# Patient Record
Sex: Female | Born: 1955 | Race: White | Hispanic: No
Health system: Southern US, Community
[De-identification: ages and names within clinical notes are randomized; demographics above are authoritative.]

---

## 2004-10-03 ENCOUNTER — Encounter (INDEPENDENT_AMBULATORY_CARE_PROVIDER_SITE_OTHER): Payer: Self-pay | Admitting: Specialist

## 2004-10-03 ENCOUNTER — Ambulatory Visit (HOSPITAL_COMMUNITY): Admission: RE | Admit: 2004-10-03 | Discharge: 2004-10-03 | Payer: Self-pay | Admitting: Vascular Surgery

## 2005-01-16 ENCOUNTER — Encounter: Admission: RE | Admit: 2005-01-16 | Discharge: 2005-01-16 | Payer: Self-pay | Admitting: Orthopedic Surgery

## 2005-09-22 ENCOUNTER — Other Ambulatory Visit: Admission: RE | Admit: 2005-09-22 | Discharge: 2005-09-22 | Payer: Self-pay | Admitting: Internal Medicine

## 2006-12-10 ENCOUNTER — Other Ambulatory Visit: Admission: RE | Admit: 2006-12-10 | Discharge: 2006-12-10 | Payer: Self-pay | Admitting: Obstetrics and Gynecology

## 2007-03-03 ENCOUNTER — Encounter (INDEPENDENT_AMBULATORY_CARE_PROVIDER_SITE_OTHER): Payer: Self-pay | Admitting: Obstetrics and Gynecology

## 2007-03-03 ENCOUNTER — Inpatient Hospital Stay (HOSPITAL_COMMUNITY): Admission: RE | Admit: 2007-03-03 | Discharge: 2007-03-05 | Payer: Self-pay | Admitting: Obstetrics and Gynecology

## 2010-08-27 NOTE — Op Note (Signed)
NAMEITATI, BROCKSMITH NO.:  [id number]   MEDICAL RECORD NO.:  [id number]          PATIENT TYPE:  INP   LOCATION:  9306                          FACILITY:  [facility]   PHYSICIAN:  Gerald Leitz, MD          DATE OF BIRTH:  March 15, 1956   DATE OF PROCEDURE:  03/03/2007  DATE OF DISCHARGE:                               OPERATIVE REPORT   PREOPERATIVE DIAGNOSES:  1. Complex ovarian cyst.  2. Pelvic pain.  3. Uterine prolapse.   POSTOPERATIVE DIAGNOSES:  1. Complex ovarian cyst.  2. Pelvic pain.  3. Uterine prolapse.  4. Pelvic adhesive disease.   PROCEDURE:  Laparoscopy with conversion to total abdominal hysterectomy,  bilateral salpingo-oophorectomy.   SURGEON:  Dr. Gerald Leitz.   ASSISTANT:  Charles A. Delcambre, MD   ANESTHESIA:  General.   COMPLICATIONS:  None.   FINDINGS:  Approximately 8 cm right ovary with torsion and adhesion to  the colon.   SPECIMEN:  Bilateral ovaries and fallopian tubes.   DISPOSITION OF SPECIMEN:  Sent to pathology.   ESTIMATED BLOOD LOSS:  250 mL.   URINE OUTPUT:  300 mL.   FLUIDS:  3100 mL of lactate Ringer's.   PROCEDURE:  The patient was taken to the operating room where she was  placed in dorsal lithotomy position.  She was prepped in the usual  sterile fashion.  Bivalve speculum placed into the vaginal vault.  The  anterior lip of the cervix was grasped with tenaculum and a uterine  manipulator was placed without difficulty.  The speculum was removed.  The patient was then draped in sterile fashion.  A 5 mm skin incision  was made at the umbilicus with a scalpel.  5 mm trocar was placed under  direct visualization.  Pneumoperitoneum was obtained with CO2 gas.  The  pelvis was examined.  A 5 mm skin incision was made in the right lower  quadrant.  A 5 mm trocar was placed under direct visualization.  This  was repeated on the left lower quadrant.  The pelvis was examined with  findings as above.  The patient was noted to  have a large right ovarian  cyst that appeared adherent to the large bowel or colon.  Decision was  made at this point to continue the surgery via an open laparotomy  incision.  Pneumoperitoneum was released, the trocars were removed of  direct visualization.  Skin sites were closed with Dermabond.  Pfannenstiel skin incision was made with a scalpel and taken to the  underlying layer of fascia.  The fascia was incised in midline.  Incision was extended laterally with Mayo scissors.  Superior aspect  fascial incision was grasped with Kocher clamps, elevated, underlying  rectus muscles dissected off with Mayo scissors.  The rectus muscle  separated in the midline.  Peritoneum was identified and entered  sharply.  This incision was extended superiorly and inferiorly with good  visualization of the bladder.  Balfour retractor was placed.  The bowel  was packed with moist laparotomy sponges.  The cornu of the uterus  was  grasped with Tresa Endo clamp.  The right ovary was dissected from the colon  and posterior cul-de-sac via sharp and blunt dissection.  The  uterosacral ligaments were suture ligated bilaterally, transected with  the Bovie.  The vesicouterine peritoneum was incised to the midline  bilaterally. The ureters were identified bilaterally.  Heaney clamp was  placed along the infundibulopelvic ligament bilaterally.  They were  transected with Mayo scissors and then ligated with a free tie of 0  Vicryl followed by a suture ligature of 0 Vicryl.  Excellent hemostasis  was noted.  The right ovary was then amputated from the uterus and sent  to pathology.  The bladder was dissected off the lower uterine segment  and cervix with Metzenbaum scissors and sponge stick.  The uterine  ligaments were skeletonized bilaterally, Heaney clamped, transected with  Mayo scissors and suture ligated with 0 Vicryl.  This was repeated on  the cardinal ligaments.  The uterosacral ligaments were clamped with   Heaney clamps, transected and suture ligated with 0 Vicryl.  The uterus  and cervix were amputated with Jorgenson scissors.  The vaginal cuff  angles suture was placed bilaterally and this was done with 0 Vicryl.  Vaginal cuff was closed with running stitch of 0 Vicryl. Abdomen was  then copiously irrigated.  The patient was noted to have some bleeding  along her posterior cul-de-sac high at the area of blunt dissection.  Some of the oozing involved the colon.  Pressure was applied for five  minutes.  Bleeding had decreased significantly.  Avitene was placed in  the posterior cul-de-sac.  All instruments were removed from the  patient's abdomen.  Sponge, lap, needle counts were correct x2.  The  fascia was closed with 0 PDS.  Skin was closed with staples.  Again,  sponge, lap and needle counts were correct.      Gerald Leitz, MD  Electronically Signed     TC/MEDQ  D:  03/03/2007  T:  03/04/2007  Job:  [id number]

## 2010-08-27 NOTE — H&P (Signed)
Tammy Owens, Tammy [name]:  [id number]   MEDICAL RECORD NO.:  [id number]          PATIENT TYPE:  INP   LOCATION:                                FACILITY:  [facility]   PHYSICIAN:  Gerald Leitz, MD          DATE OF BIRTH:  08/30/1955   DATE OF ADMISSION:  03/03/2007  DATE OF DISCHARGE:                              HISTORY & PHYSICAL   HISTORY OF PRESENT ILLNESS:  This is a 55 year old G3, P2-1-0-2, with  grade III uterine prolapse, complex ovarian cyst and pelvic pain who  desires definitive therapy via hysterectomy and bilateral salpingo-  oophorectomy.   OBSTETRICAL HISTORY:  1. Spontaneous vaginal delivery x2.  2. C-section x1.   GYNECOLOGIC HISTORY:  Menarche at age 45.   CONTRACEPTION:  Tubal ligation.   No history of abnormal Pap smear.  Last Pap smear on December 10, 2006,  was normal.   PAST MEDICAL HISTORY:  Negative.   PAST SURGICAL HISTORY:  1. Right ear surgery.  2. C-section x1.  3. Appendectomy.  4. Leg surgery.   MEDICATIONS:  Darvocet, Motrin.   ALLERGIES:  CODEINE.   SOCIAL HISTORY:  The patient owns a cleaning service.  She denies  tobacco, alcohol, or illicit drug use.   FAMILY HISTORY:  Negative for breast, ovarian, colon cancer.   REVIEW OF SYSTEMS:  Negative except as stated in the history of current  illness.   PHYSICAL EXAMINATION:  VITAL SIGNS:  Blood pressure 138/86, weight 133  pounds.  CARDIOVASCULAR:  Regular rate and rhythm.  LUNGS:  Clear to auscultation bilaterally.  ABDOMEN:  Soft, nontender, nondistended.  No masses.  PELVIC:  Normal external female genitalia.  No lower vaginal or cervical  lesions noted.  Bimanual exam reveals normal size uterus, right adnexal  mass, small left adnexal mass.  On exam with Valsalva, the uterus  distends approximately half way to the introitus.  EXTREMITIES:  No clubbing, cyanosis, or edema.   Ultrasound performed on November 17, 2006, shows the uterus to measure 9.2  x 4.3 x 5.2-cm.   There is a large complex cyst in the right adnexa at  7.2 x 6.3 x 6.8-cm.  There is also a complex cyst in the left ovary 2.4-  cm.  CA-125 checked on December 11, 2006, was 9.5 which is negative.   IMPRESSION:  1. Grade 2 uterine prolapse.  2. Complex ovarian cyst and pelvic pain.   PLAN:  The patient desires definitive therapy via laparoscopic-assisted  vaginal hysterectomy and bilateral salpingo-oophorectomy.  Risks,  benefits, alternatives of surgery were discussed with the patient  including, but not limited to, infection, bleeding, damage to bowel,  bladder, surrounding organs with the need for further surgery, risk of  transfusion, HIV, hepatitis B and C were discussed.  The patient  understands all risks and desires to proceed with laparoscopic-assisted  vaginal hysterectomy and bilateral salpingo-oophorectomy.      Gerald Leitz, MD  Electronically Signed     TC/MEDQ  D:  02/26/2007  T:  02/26/2007  Job:  [id number]

## 2010-08-30 NOTE — Discharge Summary (Signed)
Tammy Owens, Tammy Owens                ACCOUNT NO.:  [id number]   MEDICAL RECORD NO.:  [id number]          PATIENT TYPE:  INP   LOCATION:  9306                          FACILITY:  [facility]   PHYSICIAN:  Gerald Leitz, MD          DATE OF BIRTH:  03/09/1956   DATE OF ADMISSION:  03/03/2007  DATE OF DISCHARGE:  03/05/2007                               DISCHARGE SUMMARY   ADMISSION DIAGNOSIS:  1. Grade 3 uterine prolapse.  2. Complex ovarian cyst.  3. Pelvic pain.   DISCHARGE DIAGNOSES:  1. Grade 3 uterine prolapse.  2. Complex ovarian cyst.  3. Pelvic pain.  4. Status post total abdominal hysterectomy and bilateral salpingo-      oophorectomy.   BRIEF HOSPITAL COURSE:  This is 55 year old with uterine prolapse and  complex ovarian cyst and pelvic pain who underwent laparoscopy with  conversion to laparotomy and total abdominal hysterectomy and bilateral  salpingo-oophorectomy.  During her surgery, the patient was noted to  have an 8-cm right ovarian cyst that was torsed as well as adhesions of  the uterus to the colon.  She did well postoperatively.  Hemoglobin on  postoperative day #1 was 9.7  She is discharged home on March 05, 2007 in stable condition   DISCHARGE MEDICATIONS:  Motrin and Vicodin.   FOLLOW UP:  She will follow up on March 08, 2007 for staple removal  and in 2 weeks for postoperative visit.   ACTIVITY:  Pelvic rest.   DIET:  Regular.      Gerald Leitz, MD  Electronically Signed     TC/MEDQ  D:  03/23/2007  T:  03/23/2007  Job:  [id number]

## 2010-08-30 NOTE — Op Note (Signed)
[name], Tammy [name]:  [id number]   MEDICAL RECORD NO.:  [id number]          PATIENT TYPE:  OIB   LOCATION:  2899                         FACILITY:  [facility]   PHYSICIAN:  Di Kindle. 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   POSTOPERATIVE DIAGNOSIS:  Painful varicose veins of the right lower  extremity with incompetent right greater saphenous vein.   PROCEDURE:  Ligation and stripping of right greater saphenous vein and  segmental excision of varicose veins of the right lower extremity.   SURGEON:  Di Kindle. Edilia Bo, M.D.   ASSISTANT:  Nurse.   ANESTHESIA:  General.   TECHNIQUE:  The patient was taken to the operating room and received a  general anesthetic.  The right lower extremity was prepped and draped in the  usual sterile fashion.  A small oblique incision was made in the inguinal  crease on the right and the saphenofemoral junction was dissected free with  branches divided between 3-0 silk ties.  The vein was then clamped and  suture ligated at the saphenofemoral junction.  The stripper was then passed  down the vein through the incompetent valves to just above the knee, where  was identified and through a longitudinal incision, the vein was controlled  with a 2-0 silk tie.  This was then left.  All of her large varicose veins  had been marked with the patient standing preoperatively and using small  longitudinal stab incisions, a stab avulsion was performed on all the areas  which had been marked.  There was a cluster along the anterior aspect of the  right leg and also the medial aspect of the right leg.  There were also some  smaller clusters along the lateral aspect of the right leg.  Pressure was  held for  hemostasis.  This stab wounds were all closed with 4-0 Vicryls.  The greater saphenous vein was then stripped using an inversion stripping  technique and pressure held for hemostasis.  The distal incision was closed  with a 3-0 Vicryl and the skin closed with 4-0 Vicryl.  The groin incision  was closed with a deep layer of 3-0 Vicryl and the skin with 4-0 Vicryl.  A  sterile dressing was applied.  The patient tolerated procedure well and was  transferred to the recovery room in satisfactory condition.  All needle and  sponge counts were correct.       CSD/MEDQ  D:  10/03/2004  T:  10/03/2004  Job:  [id number]

## 2010-10-03 ENCOUNTER — Emergency Department (HOSPITAL_BASED_OUTPATIENT_CLINIC_OR_DEPARTMENT_OTHER)
Admission: EM | Admit: 2010-10-03 | Discharge: 2010-10-03 | Disposition: A | Discharge status: Home / Self Care | Payer: BC Managed Care – PPO | Attending: Emergency Medicine | Admitting: Emergency Medicine

## 2010-10-03 ENCOUNTER — Emergency Department (INDEPENDENT_AMBULATORY_CARE_PROVIDER_SITE_OTHER): Payer: BC Managed Care – PPO

## 2010-10-03 DIAGNOSIS — W19XXXA Unspecified fall, initial encounter: Secondary | ICD-10-CM

## 2010-10-03 DIAGNOSIS — M79609 Pain in unspecified limb: Secondary | ICD-10-CM

## 2010-10-03 DIAGNOSIS — S42253A Displaced fracture of greater tuberosity of unspecified humerus, initial encounter for closed fracture: Secondary | ICD-10-CM | POA: Insufficient documentation

## 2010-10-03 DIAGNOSIS — W1809XA Striking against other object with subsequent fall, initial encounter: Secondary | ICD-10-CM | POA: Insufficient documentation

## 2010-10-03 DIAGNOSIS — Y92009 Unspecified place in unspecified non-institutional (private) residence as the place of occurrence of the external cause: Secondary | ICD-10-CM | POA: Insufficient documentation

## 2011-01-21 LAB — DIFFERENTIAL
Basophils Absolute: 0
Basophils Relative: 1
Eosinophils Absolute: 0.1 — ABNORMAL LOW
Eosinophils Relative: 1
Lymphocytes Relative: 30
Lymphs Abs: 2.3
Monocytes Absolute: 0.4
Monocytes Relative: 5
Neutro Abs: 4.8
Neutrophils Relative %: 64

## 2011-01-21 LAB — BASIC METABOLIC PANEL
BUN: 15
CO2: 28
Calcium: 9.7
Chloride: 108
Creatinine, Ser: 0.68
GFR calc Af Amer: >60
GFR calc non Af Amer: >60
Glucose, Bld: 86
Potassium: 4.6
Sodium: 142

## 2011-01-21 LAB — TYPE AND SCREEN
ABO/RH(D): B POS
Antibody Screen: NEGATIVE

## 2011-01-21 LAB — CBC
HCT: 28.5 — ABNORMAL LOW
HCT: 36.4
Hemoglobin: 9.7 — ABNORMAL LOW
Hemoglobin: 12.5
MCHC: 34.2
MCHC: 34.3
MCV: 91.8
MCV: 90.4
Platelets: 202
Platelets: 410 — ABNORMAL HIGH
RBC: 3.1 — ABNORMAL LOW
RBC: 4.03
RDW: 13
RDW: 13
WBC: 6.2
WBC: 7.5

## 2011-01-21 LAB — ABO/RH: ABO/RH(D): B POS

## 2011-01-21 LAB — PREGNANCY, URINE: Preg Test, Ur: NEGATIVE

## 2013-01-29 IMAGING — CR DG HUMERUS 2V *L*
2 series · 2 of 2 positions shown · non-contrast
Comparison: None.

CLINICAL DATA: Fall, pain.

LEFT HUMERUS - 2+ VIEW

[w humerus ap left *]
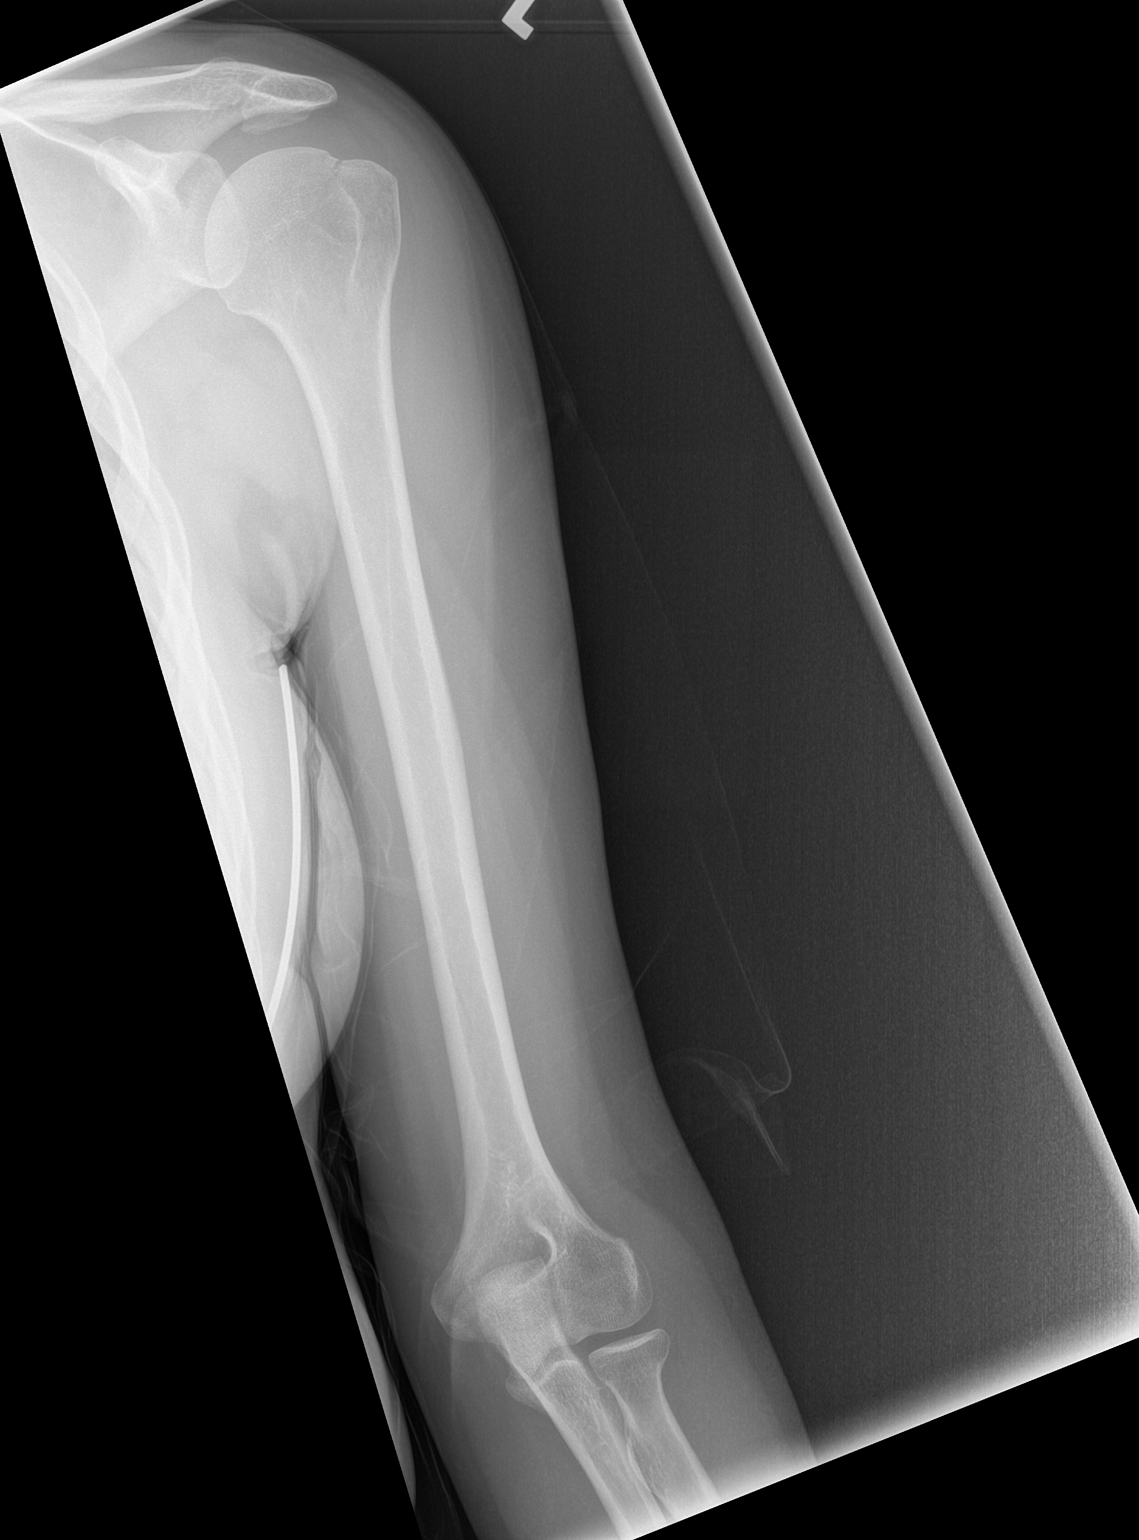

[w humerus lat left *]
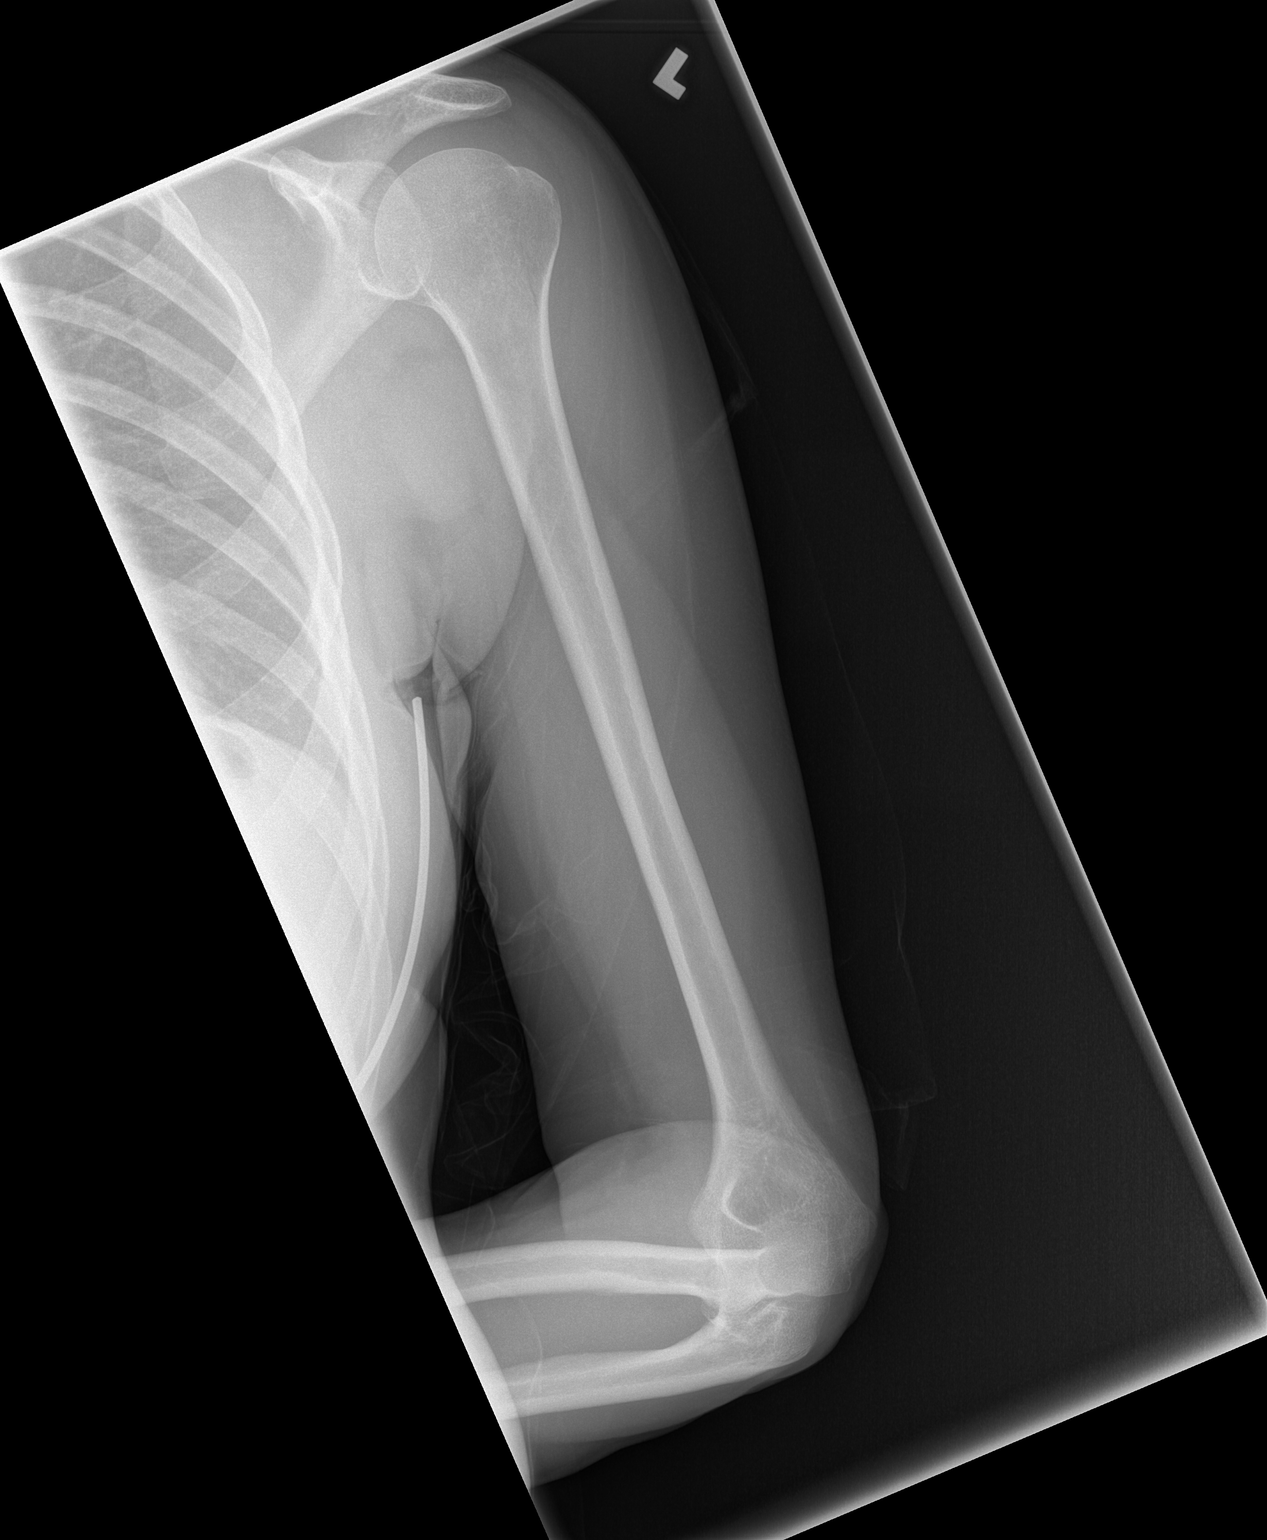

[2 of 2 positions shown; findings below may reference images not displayed]

FINDINGS: The patient has a nondisplaced fracture of the greater
tuberosity.  No other acute bony or joint abnormality is
identified.
IMPRESSION: Nondisplaced greater tuberosity fracture.

## 2018-07-27 ENCOUNTER — Telehealth: Payer: Self-pay | Admitting: Internal Medicine

## 2018-07-27 NOTE — Telephone Encounter (Signed)
VM box is full and cannot accept messages. 07-27-18 ST

## 2018-07-27 NOTE — Telephone Encounter (Signed)
Attempted to call pt for visit pre-call and change appt to telephone or video visit. No answer and unable to leave VM.

## 2018-07-28 ENCOUNTER — Telehealth: Payer: Self-pay

## 2018-07-28 NOTE — Telephone Encounter (Signed)
Appointment cancelled

## 2018-07-28 NOTE — Telephone Encounter (Signed)
Returned call to patient.  She has [employer].  We are not in network.  She has opted to cancel the appointment.  Message routed back to nurse to cancel.

## 2018-07-28 NOTE — Telephone Encounter (Signed)
New Message ° ° ° ° °Patient returning your call please call her back. °

## 2018-07-28 NOTE — Telephone Encounter (Signed)
lmtcb

## 2018-07-28 NOTE — Telephone Encounter (Signed)
Returned call to patient. Patient had a billing question. She is worried that Dr Rennis Golden is out of network w/ her insurance.  Message routed to billing for follow-up.

## 2018-07-29 ENCOUNTER — Ambulatory Visit: Payer: Self-pay | Admitting: Internal Medicine

## 2019-06-23 ENCOUNTER — Ambulatory Visit: Payer: Self-pay

## 2019-06-24 ENCOUNTER — Ambulatory Visit: Payer: Self-pay | Attending: Internal Medicine

## 2019-06-24 DIAGNOSIS — Z23 Encounter for immunization: Secondary | ICD-10-CM

## 2019-06-24 NOTE — Progress Notes (Signed)
   Covid-19 Vaccination Clinic  Name:  Tammy Owens    MRN: [id number] DOB: Jul 11, 1955  06/24/2019  [name] was observed post Covid-19 immunization for 15 minutes without incident. She was provided with Vaccine Information Sheet and instruction to access the V-Safe system.   [name] was instructed to call 911 with any severe reactions post vaccine: Marland Kitchen Difficulty breathing  . Swelling of face and throat  . A fast heartbeat  . A bad rash all over body  . Dizziness and weakness   Immunizations Administered    Name Date Dose VIS Date Route   Pfizer COVID-19 Vaccine 06/24/2019  8:46 AM 0.3 mL 03/25/2019 Intramuscular   Manufacturer: [employer], [employer]   Lot: [id number]   NDC: [address]-5

## 2019-07-19 ENCOUNTER — Ambulatory Visit: Payer: Self-pay | Attending: Internal Medicine

## 2019-07-19 DIAGNOSIS — Z23 Encounter for immunization: Secondary | ICD-10-CM

## 2019-07-19 NOTE — Progress Notes (Signed)
   Covid-19 Vaccination Clinic  Name:  Tammy Owens    MRN: [id number] DOB: [birth year]  07/19/2019  [name] was observed post Covid-19 immunization for 15 minutes without incident. She was provided with Vaccine Information Sheet and instruction to access the V-Safe system.   [name] was instructed to call 911 with any severe reactions post vaccine: Marland Kitchen Difficulty breathing  . Swelling of face and throat  . A fast heartbeat  . A bad rash all over body  . Dizziness and weakness   Immunizations Administered    Name Date Dose VIS Date Route   Pfizer COVID-19 Vaccine 07/19/2019  8:54 AM 0.3 mL 03/25/2019 Intramuscular   Manufacturer: [employer], [employer]   Lot: [id number]   NDC: [address]-5
